# Patient Record
Sex: Female | Born: 1965 | Race: White | Hispanic: No | Marital: Married | Smoking: Never smoker
Health system: Southern US, Community
[De-identification: ages and names within clinical notes are randomized; demographics above are authoritative.]

## PROBLEM LIST (undated history)

## (undated) DIAGNOSIS — E079 Disorder of thyroid, unspecified: Secondary | ICD-10-CM

## (undated) DIAGNOSIS — E039 Hypothyroidism, unspecified: Secondary | ICD-10-CM

## (undated) DIAGNOSIS — E119 Type 2 diabetes mellitus without complications: Secondary | ICD-10-CM

## (undated) DIAGNOSIS — I499 Cardiac arrhythmia, unspecified: Secondary | ICD-10-CM

## (undated) DIAGNOSIS — E785 Hyperlipidemia, unspecified: Secondary | ICD-10-CM

## (undated) HISTORY — PX: ABDOMINAL HYSTERECTOMY: SHX81

## (undated) HISTORY — PX: BLADDER SURGERY: SHX569

---

## 2010-01-23 ENCOUNTER — Ambulatory Visit: Payer: Self-pay | Admitting: Internal Medicine

## 2012-03-12 ENCOUNTER — Ambulatory Visit: Payer: Self-pay | Admitting: Family Medicine

## 2017-04-27 ENCOUNTER — Ambulatory Visit
Admission: EM | Admit: 2017-04-27 | Discharge: 2017-04-27 | Disposition: A | Discharge status: Home / Self Care | Payer: BLUE CROSS/BLUE SHIELD | Attending: Family Medicine | Admitting: Family Medicine

## 2017-04-27 ENCOUNTER — Encounter: Payer: Self-pay | Admitting: Emergency Medicine

## 2017-04-27 DIAGNOSIS — J020 Streptococcal pharyngitis: Secondary | ICD-10-CM | POA: Diagnosis not present

## 2017-04-27 HISTORY — DX: Type 2 diabetes mellitus without complications: E11.9

## 2017-04-27 HISTORY — DX: Disorder of thyroid, unspecified: E07.9

## 2017-04-27 LAB — RAPID STREP SCREEN (MED CTR MEBANE ONLY): Streptococcus, Group A Screen (Direct): POSITIVE — AB

## 2017-04-27 MED ORDER — PENICILLIN G BENZATHINE 1200000 UNIT/2ML IM SUSP
1.2000 10*6.[IU] | Freq: Once | INTRAMUSCULAR | Status: AC
  Administered 2017-04-27: 1.2 10*6.[IU] via INTRAMUSCULAR

## 2017-04-27 NOTE — ED Triage Notes (Signed)
Patient c/o sore throat since Friday night.  Patient denies fevers

## 2017-04-27 NOTE — ED Provider Notes (Signed)
[facility]    CSN: [id number] Arrival date & time: 04/27/17  0847     History   Chief Complaint Chief Complaint  Patient presents with  . Sore Throat    HPI Donna Strickland is a 51 y.o. female.   Patient is a 51 year old white female who started having a sore throat on Friday. She has some chills did not check her temperature but also had cough better so she came in today to be seen and evaluated. History of diabetes and thyroid disease. Past medical history is positive for abdominal hysterectomy she does not smoke. No known drug allergies.   The history is provided by the patient. No language interpreter was used.  Sore Throat  This is a new problem. The current episode started more than 2 days ago. The problem occurs constantly. The problem has been gradually worsening. Pertinent negatives include no chest pain, no abdominal pain, no headaches and no shortness of breath. The symptoms are aggravated by eating. Nothing relieves the symptoms. Treatments tried: Motrin.    Past Medical History:  Diagnosis Date  . Diabetes mellitus without complication ([place])   . Thyroid disease     There are no active problems to display for this patient.   Past Surgical History:  Procedure Laterality Date  . ABDOMINAL HYSTERECTOMY      OB History    No data available       Home Medications    Prior to Admission medications   Medication Sig Start Date End Date Taking? Authorizing Provider  levothyroxine (SYNTHROID, LEVOTHROID) 100 MCG tablet Take 100 mcg by mouth daily before breakfast.   Yes [provider]  metFORMIN (GLUCOPHAGE) 1000 MG tablet Take 1,000 mg by mouth 2 (two) times daily with a meal.   Yes [provider]    Family History History reviewed. No pertinent family history.  Social History Social History  Substance Use Topics  . Smoking status: Never Smoker  . Smokeless tobacco: Never Used  . Alcohol use No     Allergies     Patient has no known allergies.   Review of Systems Review of Systems  HENT: Positive for sore throat.   Respiratory: Negative for shortness of breath.   Cardiovascular: Negative for chest pain.  Gastrointestinal: Negative for abdominal pain.  Neurological: Negative for headaches.  All other systems reviewed and are negative.    Physical Exam Triage Vital Signs ED Triage Vitals  Enc Vitals Group     BP 04/27/17 0905 110/79     Pulse Rate 04/27/17 0905 (!) 107     Resp 04/27/17 0905 16     Temp 04/27/17 0905 99.1 F (37.3 C)     Temp Source 04/27/17 0905 Oral     SpO2 04/27/17 0905 100 %     Weight 04/27/17 0901 193 lb (87.5 kg)     Height 04/27/17 0901 5\' 7"  (1.702 m)     Head Circumference --      Peak Flow --      Pain Score 04/27/17 0902 9     Pain Loc --      Pain Edu? --      Excl. in [place]? --    No data found.   Updated Vital Signs BP 110/79 (BP Location: Left Arm)   Pulse (!) 107   Temp 99.1 F (37.3 C) (Oral)   Resp 16   Ht 5\' 7"  (1.702 m)   Wt 193 lb (87.5 kg)  SpO2 100%   BMI 30.23 kg/m   Visual Acuity Right Eye Distance:   Left Eye Distance:   Bilateral Distance:    Right Eye Near:   Left Eye Near:    Bilateral Near:     Physical Exam  Constitutional: She appears well-developed and well-nourished.  HENT:  Head: Normocephalic and atraumatic.  Right Ear: Hearing, tympanic membrane, external ear and ear canal normal.  Left Ear: Hearing, tympanic membrane and external ear normal.  Nose: Nose normal. No mucosal edema.  Mouth/Throat: Uvula is midline. Posterior oropharyngeal erythema present. No tonsillar abscesses. Tonsils are 2+ on the right. Tonsils are 2+ on the left. Tonsillar exudate.  Eyes: Pupils are equal, round, and reactive to light.  Neck: Normal range of motion.  Pulmonary/Chest: Effort normal.  Musculoskeletal: Normal range of motion.  Lymphadenopathy:    She has cervical adenopathy.  Neurological: She is alert.  Skin: Skin  is warm.  Psychiatric: She has a normal mood and affect.  Vitals reviewed.    UC Treatments / Results  Labs (all labs ordered are listed, but only abnormal results are displayed) Labs Reviewed  RAPID STREP SCREEN (NOT AT [facility]) - Abnormal; Notable for the following:       Result Value   Streptococcus, Group A Screen (Direct) POSITIVE (*)    All other components within normal limits    EKG  EKG Interpretation None       Radiology No results found.  Procedures Procedures (including critical care time)  Medications Ordered in UC Medications  penicillin g benzathine (BICILLIN LA) 1200000 UNIT/2ML injection 1.2 Million Units (not administered)   Results for orders placed or performed during the hospital encounter of 04/27/17  Rapid strep screen  Result Value Ref Range   Streptococcus, Group A Screen (Direct) POSITIVE (A) NEGATIVE    Initial Impression / Assessment and Plan / UC Course  I have reviewed the triage vital signs and the nursing notes.  Pertinent labs & imaging results that were available during my care of the patient were reviewed by me and considered in my medical decision making (see chart for details).    LA Bicillin 1.2 milliunits IM given. Work note for tomorrow as well follow-up with PCP in 2 weeks needed. Final Clinical Impressions(s) / UC Diagnoses   Final diagnoses:  Strep pharyngitis    New Prescriptions New Prescriptions   No medications on file    Note: This dictation was prepared with Dragon dictation along with smaller phrase technology. Any transcriptional errors that result from this process are unintentional.   Frederich Cha, MD 04/27/17 [id number]

## 2017-12-19 ENCOUNTER — Encounter: Payer: Self-pay | Admitting: Emergency Medicine

## 2017-12-19 ENCOUNTER — Ambulatory Visit
Admission: EM | Admit: 2017-12-19 | Discharge: 2017-12-19 | Disposition: A | Discharge status: Home / Self Care | Payer: BLUE CROSS/BLUE SHIELD | Attending: Family Medicine | Admitting: Family Medicine

## 2017-12-19 ENCOUNTER — Other Ambulatory Visit: Payer: Self-pay

## 2017-12-19 DIAGNOSIS — S29019A Strain of muscle and tendon of unspecified wall of thorax, initial encounter: Secondary | ICD-10-CM

## 2017-12-19 DIAGNOSIS — S29012A Strain of muscle and tendon of back wall of thorax, initial encounter: Secondary | ICD-10-CM | POA: Diagnosis not present

## 2017-12-19 HISTORY — DX: Hyperlipidemia, unspecified: E78.5

## 2017-12-19 MED ORDER — CYCLOBENZAPRINE HCL 10 MG PO TABS
10.0000 mg | ORAL_TABLET | Freq: Three times a day (TID) | ORAL | 0 refills | Status: DC | PRN
Start: 2017-12-19 — End: 2020-08-04

## 2017-12-19 NOTE — ED Provider Notes (Signed)
[facility]    CSN: [id number] Arrival date & time: 12/19/17  1631     History   Chief Complaint Chief Complaint  Patient presents with  . Back Pain    APPT    HPI Donna Strickland is a 51 y.o. female.   51 yo female with a c/o left upper back pain for 2-3 days. Denies any falls, injuries/trauma, rash, fevers, chills.   The history is provided by the patient.  Back Pain    Past Medical History:  Diagnosis Date  . Diabetes mellitus without complication ([place])   . Hyperlipidemia   . Thyroid disease     There are no active problems to display for this patient.   Past Surgical History:  Procedure Laterality Date  . ABDOMINAL HYSTERECTOMY    . BLADDER SURGERY      OB History    No data available       Home Medications    Prior to Admission medications   Medication Sig Start Date End Date Taking? Authorizing Provider  levothyroxine (SYNTHROID, LEVOTHROID) 100 MCG tablet Take 100 mcg by mouth daily before breakfast.   Yes [provider]  metFORMIN (GLUCOPHAGE) 1000 MG tablet Take 1,000 mg by mouth 2 (two) times daily with a meal.   Yes [provider]  simvastatin (ZOCOR) 10 MG tablet Take 10 mg by mouth daily.   Yes [provider]  cyclobenzaprine (FLEXERIL) 10 MG tablet Take 1 tablet (10 mg total) by mouth 3 (three) times daily as needed for muscle spasms. 12/19/17   Norval Gable, MD    Family History Family History  Problem Relation Age of Onset  . Diabetes Mother   . Diabetes Father   . Hypertension Father     Social History Social History   Tobacco Use  . Smoking status: Never Smoker  . Smokeless tobacco: Never Used  Substance Use Topics  . Alcohol use: No  . Drug use: No     Allergies   Patient has no known allergies.   Review of Systems Review of Systems  Musculoskeletal: Positive for back pain.     Physical Exam Triage Vital Signs ED Triage Vitals  Enc Vitals Group     BP  12/19/17 1648 117/69     Pulse Rate 12/19/17 1648 76     Resp 12/19/17 1648 16     Temp 12/19/17 1648 98.2 F (36.8 C)     Temp Source 12/19/17 1648 Oral     SpO2 12/19/17 1648 100 %     Weight 12/19/17 1647 185 lb (83.9 kg)     Height 12/19/17 1647 5\' 7"  (1.702 m)     Head Circumference --      Peak Flow --      Pain Score 12/19/17 1648 8     Pain Loc --      Pain Edu? --      Excl. in [place]? --    No data found.  Updated Vital Signs BP 117/69 (BP Location: Left Arm)   Pulse 76   Temp 98.2 F (36.8 C) (Oral)   Resp 16   Ht 5\' 7"  (1.702 m)   Wt 185 lb (83.9 kg)   SpO2 100%   BMI 28.98 kg/m   Visual Acuity Right Eye Distance:   Left Eye Distance:   Bilateral Distance:    Right Eye Near:   Left Eye Near:    Bilateral Near:     Physical Exam  Constitutional:  She appears well-developed and well-nourished. No distress.  Musculoskeletal:       Cervical back: She exhibits tenderness (over the left trapezius and thoracic paraspinous muscles on the left) and spasm.  Skin: She is not diaphoretic.  Nursing note and vitals reviewed.    UC Treatments / Results  Labs (all labs ordered are listed, but only abnormal results are displayed) Labs Reviewed - No data to display  EKG  EKG Interpretation None       Radiology No results found.  Procedures Procedures (including critical care time)  Medications Ordered in UC Medications - No data to display   Initial Impression / Assessment and Plan / UC Course  I have reviewed the triage vital signs and the nursing notes.  Pertinent labs & imaging results that were available during my care of the patient were reviewed by me and considered in my medical decision making (see chart for details).       Final Clinical Impressions(s) / UC Diagnoses   Final diagnoses:  Thoracic myofascial strain, initial encounter    ED Discharge Orders        Ordered    cyclobenzaprine (FLEXERIL) 10 MG tablet  3 times daily PRN       12/19/17 1808     1. diagnosis reviewed with patient 2. rx as per orders above; reviewed possible side effects, interactions, risks and benefits  3. Recommend supportive treatment with heat/ice, stretching, otc analgesics/nsaids prn 4. Follow-up prn if symptoms worsen or don't improve  Controlled Substance Prescriptions [place] Controlled Substance Registry consulted? Not Applicable   Norval Gable, MD 12/19/17 [place]

## 2017-12-19 NOTE — ED Triage Notes (Signed)
Patient in today c/o 2-3 days of upper left sided back pain and shoulder pain. No injury noted. No cough. Patient has been using Ibuprofen for the pain without a lot of relief.

## 2018-01-20 ENCOUNTER — Other Ambulatory Visit: Payer: Self-pay | Admitting: Family Medicine

## 2018-01-20 DIAGNOSIS — Z1231 Encounter for screening mammogram for malignant neoplasm of breast: Secondary | ICD-10-CM

## 2018-01-28 ENCOUNTER — Ambulatory Visit
Admission: RE | Admit: 2018-01-28 | Discharge: 2018-01-28 | Disposition: A | Discharge status: Home / Self Care | Payer: BLUE CROSS/BLUE SHIELD | Source: Ambulatory Visit | Attending: Family Medicine | Admitting: Family Medicine

## 2018-01-28 DIAGNOSIS — Z1231 Encounter for screening mammogram for malignant neoplasm of breast: Secondary | ICD-10-CM | POA: Diagnosis present

## 2020-04-10 HISTORY — PX: SVT ABLATION: EP1225

## 2020-08-04 ENCOUNTER — Ambulatory Visit
Admission: EM | Admit: 2020-08-04 | Discharge: 2020-08-04 | Disposition: A | Discharge status: Home / Self Care | Payer: BLUE CROSS/BLUE SHIELD | Attending: Internal Medicine | Admitting: Internal Medicine

## 2020-08-04 ENCOUNTER — Ambulatory Visit (INDEPENDENT_AMBULATORY_CARE_PROVIDER_SITE_OTHER): Payer: BLUE CROSS/BLUE SHIELD

## 2020-08-04 DIAGNOSIS — J209 Acute bronchitis, unspecified: Secondary | ICD-10-CM | POA: Diagnosis not present

## 2020-08-04 MED ORDER — ALBUTEROL SULFATE HFA 108 (90 BASE) MCG/ACT IN AERS
2.0000 | INHALATION_SPRAY | Freq: Four times a day (QID) | RESPIRATORY_TRACT | 0 refills | Status: AC | PRN
Start: 2020-08-04 — End: ?

## 2020-08-04 MED ORDER — METHYLPREDNISOLONE SODIUM SUCC 40 MG IJ SOLR
40.0000 mg | Freq: Once | INTRAMUSCULAR | Status: AC
  Administered 2020-08-04: 40 mg via INTRAMUSCULAR

## 2020-08-04 MED ORDER — PREDNISONE 10 MG PO TABS
10.0000 mg | ORAL_TABLET | Freq: Every day | ORAL | 0 refills | Status: AC
Start: 2020-08-04 — End: 2020-08-07

## 2020-08-04 MED ORDER — AEROCHAMBER PLUS FLO-VU LARGE MISC: 1.0000 | Freq: Once | 0 refills | Status: AC

## 2020-08-04 MED ORDER — GUAIFENESIN ER 600 MG PO TB12: 600.0000 mg | ORAL_TABLET | Freq: Two times a day (BID) | ORAL | 0 refills | Status: AC

## 2020-08-04 NOTE — ED Triage Notes (Signed)
Pt had Moderna #2 yesterday.  Sudden onset SOB x 2 hours ago while at work.  Denies CP, HA, n/v, dizziness, etc.  Wheezing noted during intake which pt states is not normal for her.

## 2020-08-04 NOTE — Discharge Instructions (Addendum)
Use inhaler with spacer as recommended If your symptoms worsen please return to the urgent care to be reevaluated

## 2020-08-04 NOTE — ED Provider Notes (Signed)
[facility]    CSN: [id number] Arrival date & time: 08/04/20  1127      History   Chief Complaint No chief complaint on file.   HPI Donna Strickland is a 54 y.o. female comes to the urgent care with 2-hour history of sudden onset shortness of breath, feeling of unable to take a deep full breath, wheezing and a chesty cough.  Patient has been having nasal stuffiness and some wet cough.  Patient denies any fever or chills.  No sick contacts.  She had a second dose of Moderna vaccine yesterday.  His symptoms predate the Moderna COVID-19 vaccination.  No palpitations.  No nausea, vomiting, diarrhea, loss of taste or smell.  HPI  Past Medical History:  Diagnosis Date  . Diabetes mellitus without complication ([place])   . Hyperlipidemia   . Thyroid disease     There are no problems to display for this patient.   Past Surgical History:  Procedure Laterality Date  . ABDOMINAL HYSTERECTOMY    . BLADDER SURGERY      OB History   No obstetric history on file.      Home Medications    Prior to Admission medications   Medication Sig Start Date End Date Taking? Authorizing Provider  Empagliflozin (JARDIANCE PO) Take by mouth.   Yes [provider]  glipiZIDE (GLUCOTROL) 10 MG tablet Take 10 mg by mouth daily before breakfast.   Yes [provider]  levothyroxine (SYNTHROID, LEVOTHROID) 100 MCG tablet Take 100 mcg by mouth daily before breakfast.   Yes [provider]  metFORMIN (GLUCOPHAGE) 1000 MG tablet Take 1,000 mg by mouth 2 (two) times daily with a meal.   Yes [provider]  simvastatin (ZOCOR) 10 MG tablet Take 10 mg by mouth daily.   Yes [provider]  albuterol (VENTOLIN HFA) 108 (90 Base) MCG/ACT inhaler Inhale 2 puffs into the lungs every 6 (six) hours as needed for wheezing or shortness of breath. 08/04/20   Delena Casebeer, Myrene Galas, MD  guaiFENesin (MUCINEX) 600 MG 12 hr tablet Take 1 tablet (600 mg total) by mouth  2 (two) times daily for 10 days. 08/04/20 08/14/20  Chase Picket, MD  predniSONE (DELTASONE) 10 MG tablet Take 1 tablet (10 mg total) by mouth daily for 3 days. 08/04/20 08/07/20  LampteyMyrene Galas, MD  Spacer/Aero-Holding Chambers (AEROCHAMBER PLUS FLO-VU LARGE) MISC 1 each by Other route once for 1 dose. 08/04/20 08/04/20  Chase Picket, MD    Family History Family History  Problem Relation Age of Onset  . Diabetes Mother   . Diabetes Father   . Hypertension Father   . Breast cancer Neg Hx     Social History Social History   Tobacco Use  . Smoking status: Never Smoker  . Smokeless tobacco: Never Used  Vaping Use  . Vaping Use: Never used  Substance Use Topics  . Alcohol use: No  . Drug use: No     Allergies   Patient has no known allergies.   Review of Systems Review of Systems  HENT: Positive for congestion. Negative for postnasal drip, sinus pressure, sinus pain, sore throat and voice change.   Respiratory: Positive for cough, chest tightness, shortness of breath and wheezing.   Cardiovascular: Negative for chest pain, palpitations and leg swelling.  Gastrointestinal: Negative.   Genitourinary: Negative.   Musculoskeletal: Negative.   Skin: Negative.   Neurological: Negative.  Negative for dizziness, numbness and headaches.  Physical Exam Triage Vital Signs ED Triage Vitals  Enc Vitals Group     BP      Pulse      Resp      Temp      Temp src      SpO2      Weight      Height      Head Circumference      Peak Flow      Pain Score      Pain Loc      Pain Edu?      Excl. in [place]?    No data found.  Updated Vital Signs BP (!) 144/76 (BP Location: Right Arm)   Pulse 95   Resp (!) 24   SpO2 93%   Visual Acuity Right Eye Distance:   Left Eye Distance:   Bilateral Distance:    Right Eye Near:   Left Eye Near:    Bilateral Near:     Physical Exam Vitals and nursing note reviewed.  Constitutional:      General: She is not in acute  distress.    Appearance: Normal appearance. She is not ill-appearing.  HENT:     Right Ear: Tympanic membrane normal.     Left Ear: Tympanic membrane normal.  Cardiovascular:     Rate and Rhythm: Normal rate and regular rhythm.     Pulses: Normal pulses.     Heart sounds: Normal heart sounds.  Pulmonary:     Effort: Respiratory distress present.     Breath sounds: No stridor. Wheezing present. No rhonchi or rales.  Abdominal:     General: Bowel sounds are normal.     Palpations: Abdomen is soft.  Musculoskeletal:        General: Normal range of motion.  Skin:    General: Skin is warm.     Capillary Refill: Capillary refill takes less than 2 seconds.  Neurological:     General: No focal deficit present.     Mental Status: She is alert and oriented to person, place, and time.      UC Treatments / Results  Labs (all labs ordered are listed, but only abnormal results are displayed) Labs Reviewed - No data to display  EKG   Radiology DG Chest 2 View  Result Date: 08/04/2020 CLINICAL DATA:  Shortness of breath for 2 hours EXAM: CHEST - 2 VIEW COMPARISON:  None. FINDINGS: The heart size and mediastinal contours are within normal limits. Both lungs are clear. The visualized skeletal structures are unremarkable. IMPRESSION: No active cardiopulmonary disease. Electronically Signed   By: Inez Catalina M.D.   On: 08/04/2020 12:20    Procedures Procedures (including critical care time)  Medications Ordered in UC Medications  methylPREDNISolone sodium succinate (SOLU-MEDROL) 40 mg/mL injection 40 mg (40 mg Intramuscular Given 08/04/20 1244)    Initial Impression / Assessment and Plan / UC Course  I have reviewed the triage vital signs and the nursing notes.  Pertinent labs & imaging results that were available during my care of the patient were reviewed by me and considered in my medical decision making (see chart for details).     1.  Acute bronchitis with  bronchospasm: Chest x-ray is negative for acute lung infiltrate Solu-Medrol 40 mg x 1 dose Prednisone 10 mg orally daily for 5 days Albuterol inhaler with a spacer Humibid as needed for sputum production Return precautions given If patient symptoms worsens she is advised to return to the urgent  care to be reevaluated. Final Clinical Impressions(s) / UC Diagnoses   Final diagnoses:  Acute bronchitis with bronchospasm     Discharge Instructions     Use inhaler with spacer as recommended If your symptoms worsen please return to the urgent care to be reevaluated    ED Prescriptions    Medication Sig Dispense Auth. Provider   albuterol (VENTOLIN HFA) 108 (90 Base) MCG/ACT inhaler Inhale 2 puffs into the lungs every 6 (six) hours as needed for wheezing or shortness of breath. 18 g Edith Lord, Myrene Galas, MD   Spacer/Aero-Holding Chambers (AEROCHAMBER PLUS FLO-VU LARGE) MISC 1 each by Other route once for 1 dose. 1 each Caiden Monsivais, Myrene Galas, MD   predniSONE (DELTASONE) 10 MG tablet Take 1 tablet (10 mg total) by mouth daily for 3 days. 3 tablet Kaleb Linquist, Myrene Galas, MD   guaiFENesin (MUCINEX) 600 MG 12 hr tablet Take 1 tablet (600 mg total) by mouth 2 (two) times daily for 10 days. 20 tablet Meela Wareing, Myrene Galas, MD     PDMP not reviewed this encounter.   Chase Picket, MD 08/04/20 1322

## 2020-10-05 ENCOUNTER — Other Ambulatory Visit
Admission: RE | Admit: 2020-10-05 | Discharge: 2020-10-05 | Disposition: A | Discharge status: Home / Self Care | Payer: BLUE CROSS/BLUE SHIELD | Source: Ambulatory Visit | Attending: Internal Medicine | Admitting: Internal Medicine

## 2020-10-05 DIAGNOSIS — Z20822 Contact with and (suspected) exposure to covid-19: Secondary | ICD-10-CM | POA: Diagnosis not present

## 2020-10-05 DIAGNOSIS — Z01818 Encounter for other preprocedural examination: Secondary | ICD-10-CM | POA: Diagnosis present

## 2020-10-05 LAB — SARS CORONAVIRUS 2 (TAT 6-24 HRS): SARS Coronavirus 2: NEGATIVE

## 2020-10-06 ENCOUNTER — Encounter: Payer: Self-pay | Admitting: Internal Medicine

## 2020-10-09 ENCOUNTER — Ambulatory Visit: Payer: BLUE CROSS/BLUE SHIELD | Admitting: Certified Registered Nurse Anesthetist

## 2020-10-09 ENCOUNTER — Ambulatory Visit
Admission: RE | Admit: 2020-10-09 | Discharge: 2020-10-09 | Disposition: A | Discharge status: Home / Self Care | Payer: BLUE CROSS/BLUE SHIELD | Attending: Internal Medicine | Admitting: Internal Medicine

## 2020-10-09 ENCOUNTER — Encounter
Admission: RE | Disposition: A | Discharge status: Home / Self Care | Payer: Self-pay | Source: Home / Self Care | Attending: Internal Medicine

## 2020-10-09 DIAGNOSIS — K640 First degree hemorrhoids: Secondary | ICD-10-CM | POA: Insufficient documentation

## 2020-10-09 DIAGNOSIS — Z86010 Personal history of colonic polyps: Secondary | ICD-10-CM | POA: Diagnosis not present

## 2020-10-09 DIAGNOSIS — Z1211 Encounter for screening for malignant neoplasm of colon: Secondary | ICD-10-CM | POA: Diagnosis present

## 2020-10-09 DIAGNOSIS — K529 Noninfective gastroenteritis and colitis, unspecified: Secondary | ICD-10-CM | POA: Insufficient documentation

## 2020-10-09 DIAGNOSIS — D120 Benign neoplasm of cecum: Secondary | ICD-10-CM | POA: Insufficient documentation

## 2020-10-09 HISTORY — DX: Cardiac arrhythmia, unspecified: I49.9

## 2020-10-09 HISTORY — PX: COLONOSCOPY WITH PROPOFOL: SHX5780

## 2020-10-09 HISTORY — DX: Hypothyroidism, unspecified: E03.9

## 2020-10-09 LAB — GLUCOSE, CAPILLARY: Glucose-Capillary: 169 mg/dL — ABNORMAL HIGH (ref 70–99)

## 2020-10-09 SURGERY — COLONOSCOPY WITH PROPOFOL
Anesthesia: General

## 2020-10-09 MED ORDER — LIDOCAINE HCL (CARDIAC) PF 100 MG/5ML IV SOSY
PREFILLED_SYRINGE | INTRAVENOUS | Status: DC | PRN
  Administered 2020-10-09: 80 mg via INTRAVENOUS

## 2020-10-09 MED ORDER — PROPOFOL 10 MG/ML IV BOLUS
INTRAVENOUS | Status: DC | PRN
  Administered 2020-10-09: 70 mg via INTRAVENOUS
  Administered 2020-10-09: 30 mg via INTRAVENOUS

## 2020-10-09 MED ORDER — SODIUM CHLORIDE 0.9 % IV SOLN: INTRAVENOUS | Status: DC

## 2020-10-09 MED ORDER — PROPOFOL 500 MG/50ML IV EMUL
INTRAVENOUS | Status: DC | PRN
  Administered 2020-10-09: 100 ug/kg/min via INTRAVENOUS

## 2020-10-09 NOTE — H&P (Signed)
  Outpatient short stay form Pre-procedure 10/09/2020 10:57 AM Donna Strickland Donna Strickland, M.D.  Primary Physician: Donna Strickland, M.D.  Reason for visit:  History of colon polyps  History of present illness:                            Patient presents for colonoscopy for a personal hx of colon polyps. The patient denies abdominal pain, abnormal weight loss or rectal bleeding.     No current facility-administered medications for this encounter.  Medications Prior to Admission  Medication Sig Dispense Refill Last Dose  . albuterol (VENTOLIN HFA) 108 (90 Base) MCG/ACT inhaler Inhale 2 puffs into the lungs every 6 (six) hours as needed for wheezing or shortness of breath. 18 g 0 Past Week at Unknown time  . Empagliflozin (JARDIANCE PO) Take by mouth.   Past Week at Unknown time  . fluticasone (FLONASE) 50 MCG/ACT nasal spray Place 2 sprays into both nostrils daily.   Past Week at Unknown time  . glipiZIDE (GLUCOTROL) 10 MG tablet Take 10 mg by mouth daily before breakfast.   Past Week at Unknown time  . levothyroxine (SYNTHROID, LEVOTHROID) 100 MCG tablet Take 100 mcg by mouth daily before breakfast.   Past Week at Unknown time  . metFORMIN (GLUCOPHAGE) 1000 MG tablet Take 1,000 mg by mouth 2 (two) times daily with a meal.   Past Week at Unknown time  . simvastatin (ZOCOR) 10 MG tablet Take 10 mg by mouth daily.   Past Week at Unknown time  . sitaGLIPtin (JANUVIA) 100 MG tablet Take 100 mg by mouth daily.   Past Week at Unknown time     Allergies  Allergen Reactions  . Sesame Seed (Diagnostic) Itching     Past Medical History:  Diagnosis Date  . Diabetes mellitus without complication ([place])   . Dysrhythmia    SVT  . Hyperlipidemia   . Hypothyroidism   . Thyroid disease     Review of systems:  Otherwise negative.    Physical Exam  Gen: Alert, oriented. Appears stated age.  HEENT: [place]/AT. PERRLA. Lungs: CTA, no wheezes. CV: RR nl S1, S2. Abd: soft, benign, no masses. BS+ Ext: No  edema. Pulses 2+    Planned procedures: Proceed with colonoscopy. The patient understands the nature of the planned procedure, indications, risks, alternatives and potential complications including but not limited to bleeding, infection, perforation, damage to internal organs and possible oversedation/side effects from anesthesia. The patient agrees and gives consent to proceed.  Please refer to procedure notes for findings, recommendations and patient disposition/instructions.     Donna Strickland Donna Strickland, M.D. Gastroenterology 10/09/2020  10:57 AM

## 2020-10-09 NOTE — Transfer of Care (Signed)
Immediate Anesthesia Transfer of Care Note  Patient: Donna Strickland  Procedure(s) Performed: COLONOSCOPY WITH PROPOFOL (N/A )  Patient Location: PACU and Endoscopy Unit  Anesthesia Type:General  Level of Consciousness: awake, drowsy and patient cooperative  Airway & Oxygen Therapy: Patient Spontanous Breathing  Post-op Assessment: Report given to RN and Post -op Vital signs reviewed and stable  Post vital signs: Reviewed and stable  Last Vitals:  Vitals Value Taken Time  BP 114/70 10/09/20 1147  Temp    Pulse 78 10/09/20 1148  Resp 14 10/09/20 1148  SpO2 94 % 10/09/20 1148  Vitals shown include unvalidated device data.  Last Pain:  Vitals:   10/09/20 1147  TempSrc:   PainSc: 0-No pain         Complications: No complications documented.

## 2020-10-09 NOTE — Op Note (Addendum)
[facility] Gastroenterology Patient Name: Donna Strickland Procedure Date: 10/09/2020 10:57 AM MRN: [id number] Account #: [id number] Date of Birth: 06/30/1966 Admit Type: Outpatient Age: 54 Room: [facility] ENDO ROOM 3 Gender: Female Note Status: Finalized Procedure:             Colonoscopy Indications:           High risk colon cancer surveillance: Personal history                         of colonic polyps Providers:             Benay Pike. Alice Reichert MD, MD Referring MD:          Kerin Perna MD, MD (Referring MD) Medicines:             Propofol per Anesthesia Complications:         No immediate complications. Procedure:             Pre-Anesthesia Assessment:                        - The risks and benefits of the procedure and the                         sedation options and risks were discussed with the                         patient. All questions were answered and informed                         consent was obtained.                        - Patient identification and proposed procedure were                         verified prior to the procedure by the nurse. The                         procedure was verified in the procedure room.                        - ASA Grade Assessment: III - A patient with severe                         systemic disease.                        - After reviewing the risks and benefits, the patient                         was deemed in satisfactory condition to undergo the                         procedure.                        After obtaining informed consent, the colonoscope was                         passed under direct vision. Throughout  the procedure,                         the patient's blood pressure, pulse, and oxygen                         saturations were monitored continuously. The                         Colonoscope was introduced through the anus and                         advanced to the the cecum, identified by  appendiceal                         orifice and ileocecal valve. The colonoscopy was                         performed without difficulty. The patient tolerated                         the procedure well. The quality of the bowel                         preparation was good. The ileocecal valve, appendiceal                         orifice, and rectum were photographed. The ileocecal                         valve, appendiceal orifice, and rectum were                         photographed. Findings:      The perianal and digital rectal examinations were normal. Pertinent       negatives include normal sphincter tone and no palpable rectal lesions.      Two sessile polyps were found in the cecum. The polyps were 3 to 4 mm in       size. These polyps were removed with a jumbo cold forceps. Resection and       retrieval were complete.      A segmental area of mildly erythematous mucosa was found in the       ascending colon. This was biopsied with a cold forceps for histology.      Non-bleeding internal hemorrhoids were found during retroflexion. The       hemorrhoids were Grade I (internal hemorrhoids that do not prolapse).      The exam was otherwise without abnormality. Impression:            - Two 3 to 4 mm polyps in the cecum, removed with a                         jumbo cold forceps. Resected and retrieved.                        - Erythematous mucosa in the ascending colon. Biopsied.                        - Non-bleeding internal  hemorrhoids.                        - The examination was otherwise normal. Recommendation:        - Patient has a contact number available for                         emergencies. The signs and symptoms of potential                         delayed complications were discussed with the patient.                         Return to normal activities tomorrow. Written                         discharge instructions were provided to the patient.                         - Resume previous diet.                        - Continue present medications.                        - Await pathology results.                        - Repeat colonoscopy is recommended for surveillance.                         The colonoscopy date will be determined after                         pathology results from today's exam become available                         for review.                        - Return to GI office PRN. Procedure Code(s):     --- Professional ---                        [phone], Colonoscopy, flexible; with biopsy, single or                         multiple Diagnosis Code(s):     --- Professional ---                        K64.0, First degree hemorrhoids                        K63.89, Other specified diseases of intestine                        K63.5, Polyp of colon                        Z86.010, Personal history of colonic polyps CPT copyright 2019 American Medical Association. All rights reserved. The codes documented in this report are preliminary  and upon coder review may  be revised to meet current compliance requirements. Efrain Sella MD, MD 10/09/2020 11:48:02 AM This report has been signed electronically. Number of Addenda: 0 Note Initiated On: 10/09/2020 10:57 AM Scope Withdrawal Time: 0 hours 10 minutes 2 seconds  Total Procedure Duration: 0 hours 17 minutes 7 seconds  Estimated Blood Loss:  Estimated blood loss: none.      [facility]

## 2020-10-09 NOTE — Interval H&P Note (Signed)
History and Physical Interval Note:  10/09/2020 11:00 AM  Donna Strickland  has presented today for surgery, with the diagnosis of Personal history of colon polyps.  The various methods of treatment have been discussed with the patient and family. After consideration of risks, benefits and other options for treatment, the patient has consented to  Procedure(s): COLONOSCOPY WITH PROPOFOL (N/A) as a surgical intervention.  The patient's history has been reviewed, patient examined, no change in status, stable for surgery.  I have reviewed the patient's chart and labs.  Questions were answered to the patient's satisfaction.     [place], [place]

## 2020-10-09 NOTE — Anesthesia Preprocedure Evaluation (Signed)
Anesthesia Evaluation  Patient identified by MRN, date of birth, ID band Patient awake    Reviewed: Allergy & Precautions, H&P , NPO status , Patient's Chart, lab work & pertinent test results, reviewed documented beta blocker date and time   Airway Mallampati: II   Neck ROM: full    Dental  (+) Poor Dentition   Pulmonary neg pulmonary ROS,    Pulmonary exam normal        Cardiovascular Exercise Tolerance: Good Normal cardiovascular exam+ dysrhythmias  Rhythm:regular Rate:Normal     Neuro/Psych negative neurological ROS  negative psych ROS   GI/Hepatic negative GI ROS, Neg liver ROS,   Endo/Other  diabetes, Well Controlled, Type 2, Oral Hypoglycemic AgentsHypothyroidism   Renal/GU negative Renal ROS  negative genitourinary   Musculoskeletal   Abdominal   Peds  Hematology negative hematology ROS (+)   Anesthesia Other Findings Past Medical History: No date: Diabetes mellitus without complication (HCC) No date: Dysrhythmia     Comment:  SVT No date: Hyperlipidemia No date: Hypothyroidism No date: Thyroid disease Past Surgical History: No date: ABDOMINAL HYSTERECTOMY No date: BLADDER SURGERY 04/10/2020: SVT ABLATION BMI    Body Mass Index: 31.17 kg/m     Reproductive/Obstetrics negative OB ROS                             Anesthesia Physical Anesthesia Plan  ASA: II  Anesthesia Plan: General   Post-op Pain Management:    Induction:   PONV Risk Score and Plan:   Airway Management Planned:   Additional Equipment:   Intra-op Plan:   Post-operative Plan:   Informed Consent: I have reviewed the patients History and Physical, chart, labs and discussed the procedure including the risks, benefits and alternatives for the proposed anesthesia with the patient or authorized representative who has indicated his/her understanding and acceptance.     Dental Advisory  Given  Plan Discussed with: CRNA  Anesthesia Plan Comments:         Anesthesia Quick Evaluation

## 2020-10-10 ENCOUNTER — Encounter: Payer: Self-pay | Admitting: Internal Medicine

## 2020-10-10 LAB — SURGICAL PATHOLOGY

## 2020-10-10 NOTE — Anesthesia Postprocedure Evaluation (Signed)
Anesthesia Post Note  Patient: Donna Strickland  Procedure(s) Performed: COLONOSCOPY WITH PROPOFOL (N/A )  Patient location during evaluation: PACU Anesthesia Type: General Level of consciousness: awake and alert Pain management: pain level controlled Vital Signs Assessment: post-procedure vital signs reviewed and stable Respiratory status: spontaneous breathing, nonlabored ventilation and respiratory function stable Cardiovascular status: blood pressure returned to baseline and stable Postop Assessment: no apparent nausea or vomiting Anesthetic complications: no   No complications documented.   Last Vitals:  Vitals:   10/09/20 1157 10/09/20 1207  BP: 109/76 123/76  Pulse: 74 68  Resp: (!) 21 18  Temp:    SpO2: 97% 97%    Last Pain:  Vitals:   10/10/20 0732  TempSrc:   PainSc: 0-No pain                 Brett Canales Peggye Poon

## 2021-01-29 ENCOUNTER — Other Ambulatory Visit: Payer: Self-pay | Admitting: Family Medicine

## 2021-01-29 DIAGNOSIS — Z1231 Encounter for screening mammogram for malignant neoplasm of breast: Secondary | ICD-10-CM

## 2021-02-06 ENCOUNTER — Ambulatory Visit
Admission: RE | Admit: 2021-02-06 | Discharge: 2021-02-06 | Disposition: A | Discharge status: Home / Self Care | Payer: BLUE CROSS/BLUE SHIELD | Source: Ambulatory Visit | Attending: Family Medicine | Admitting: Family Medicine

## 2021-02-06 ENCOUNTER — Other Ambulatory Visit: Payer: Self-pay

## 2021-02-06 DIAGNOSIS — Z1231 Encounter for screening mammogram for malignant neoplasm of breast: Secondary | ICD-10-CM | POA: Insufficient documentation

## 2021-03-26 ENCOUNTER — Other Ambulatory Visit: Payer: Self-pay

## 2021-03-26 ENCOUNTER — Ambulatory Visit
Admission: EM | Admit: 2021-03-26 | Discharge: 2021-03-26 | Disposition: A | Discharge status: Home / Self Care | Payer: BLUE CROSS/BLUE SHIELD | Attending: Family Medicine | Admitting: Family Medicine

## 2021-03-26 DIAGNOSIS — J029 Acute pharyngitis, unspecified: Secondary | ICD-10-CM | POA: Insufficient documentation

## 2021-03-26 LAB — GROUP A STREP BY PCR: Group A Strep by PCR: NOT DETECTED

## 2021-03-26 MED ORDER — LIDOCAINE VISCOUS HCL 2 % MT SOLN: OROMUCOSAL | 0 refills | Status: AC

## 2021-03-26 MED ORDER — NAPROXEN 500 MG PO TABS: 500.0000 mg | ORAL_TABLET | Freq: Two times a day (BID) | ORAL | 0 refills | Status: AC | PRN

## 2021-03-26 NOTE — ED Provider Notes (Signed)
[facility]    CSN: [id number] Arrival date & time: 03/26/21  7209      History   Chief Complaint Chief Complaint  Patient presents with  . Sore Throat   HPI  55 year old female presents with tightness in her throat.  Started abruptly last night.  Patient reports that she has some pain with swallowing and feels like her throat is "closing up".  She reports associated fatigue.  She states that she had one episode of emesis when she tried to eat and drink something.  No fever.  No cough.  Patient does sound slightly hoarse.  No other reported symptoms.  No relieving factors.  No other complaints.  Past Medical History:  Diagnosis Date  . Diabetes mellitus without complication ([place])   . Dysrhythmia    SVT  . Hyperlipidemia   . Hypothyroidism   . Thyroid disease     There are no problems to display for this patient.   Past Surgical History:  Procedure Laterality Date  . ABDOMINAL HYSTERECTOMY    . BLADDER SURGERY    . COLONOSCOPY WITH PROPOFOL N/A 10/09/2020   Procedure: COLONOSCOPY WITH PROPOFOL;  Surgeon: Toledo, Benay Pike, MD;  Location: [facility] ENDOSCOPY;  Service: Gastroenterology;  Laterality: N/A;  . SVT ABLATION  04/10/2020    OB History   No obstetric history on file.      Home Medications    Prior to Admission medications   Medication Sig Start Date End Date Taking? Authorizing Provider  albuterol (VENTOLIN HFA) 108 (90 Base) MCG/ACT inhaler Inhale 2 puffs into the lungs every 6 (six) hours as needed for wheezing or shortness of breath. 08/04/20  Yes Lamptey, Myrene Galas, MD  Empagliflozin (JARDIANCE PO) Take by mouth.   Yes [provider]  fluticasone (FLONASE) 50 MCG/ACT nasal spray Place 2 sprays into both nostrils daily.   Yes [provider]  glipiZIDE (GLUCOTROL) 10 MG tablet Take 10 mg by mouth daily before breakfast.   Yes [provider]  levothyroxine (SYNTHROID, LEVOTHROID) 100 MCG tablet Take 100 mcg by mouth  daily before breakfast.   Yes [provider]  lidocaine (XYLOCAINE) 2 % solution Gargle 15 mL every 3 hours as needed. May swallow if desired. 03/26/21  Yes Reylynn Vanalstine G, DO  metFORMIN (GLUCOPHAGE) 1000 MG tablet Take 1,000 mg by mouth 2 (two) times daily with a meal.   Yes [provider]  naproxen (NAPROSYN) 500 MG tablet Take 1 tablet (500 mg total) by mouth 2 (two) times daily as needed for moderate pain. 03/26/21  Yes Novice Vrba G, DO  simvastatin (ZOCOR) 10 MG tablet Take 10 mg by mouth daily.   Yes [provider]  sitaGLIPtin (JANUVIA) 100 MG tablet Take 100 mg by mouth daily.   Yes [provider]    Family History Family History  Problem Relation Age of Onset  . Diabetes Mother   . Diabetes Father   . Hypertension Father   . Breast cancer Neg Hx     Social History Social History   Tobacco Use  . Smoking status: Never Smoker  . Smokeless tobacco: Never Used  Vaping Use  . Vaping Use: Never used  Substance Use Topics  . Alcohol use: No  . Drug use: No     Allergies   Sesame seed (diagnostic)   Review of Systems Review of Systems Per HPI  Physical Exam Triage Vital Signs ED Triage Vitals  Enc Vitals Group  BP 03/26/21 0937 114/81     Pulse Rate 03/26/21 0937 (!) 103     Resp 03/26/21 0937 19     Temp 03/26/21 0937 98.5 F (36.9 C)     Temp Source 03/26/21 0937 Oral     SpO2 03/26/21 0937 97 %     Weight 03/26/21 0936 195 lb (88.5 kg)     Height 03/26/21 0936 5\' 7"  (1.702 m)     Head Circumference --      Peak Flow --      Pain Score 03/26/21 0936 0     Pain Loc --      Pain Edu? --      Excl. in [place]? --    Updated Vital Signs BP 114/81 (BP Location: Left Arm)   Pulse (!) 103   Temp 98.5 F (36.9 C) (Oral)   Resp 19   Ht 5\' 7"  (1.702 m)   Wt 88.5 kg   SpO2 97%   BMI 30.54 kg/m   Visual Acuity Right Eye Distance:   Left Eye Distance:   Bilateral Distance:    Right Eye Near:   Left Eye Near:     Bilateral Near:     Physical Exam Vitals and nursing note reviewed.  Constitutional:      General: She is not in acute distress.    Appearance: Normal appearance. She is not ill-appearing.  HENT:     Head: Normocephalic and atraumatic.     Right Ear: Tympanic membrane normal.     Left Ear: Tympanic membrane normal.     Mouth/Throat:     Pharynx: Posterior oropharyngeal erythema present.  Eyes:     General:        Right eye: No discharge.        Left eye: No discharge.     Conjunctiva/sclera: Conjunctivae normal.  Cardiovascular:     Rate and Rhythm: Normal rate and regular rhythm.     Heart sounds: No murmur heard.   Pulmonary:     Effort: Pulmonary effort is normal.     Breath sounds: Normal breath sounds. No wheezing, rhonchi or rales.  Neurological:     Mental Status: She is alert.  Psychiatric:        Mood and Affect: Mood normal.        Behavior: Behavior normal.    UC Treatments / Results  Labs (all labs ordered are listed, but only abnormal results are displayed) Labs Reviewed  GROUP A STREP BY PCR    EKG   Radiology No results found.  Procedures Procedures (including critical care time)  Medications Ordered in UC Medications - No data to display  Initial Impression / Assessment and Plan / UC Course  I have reviewed the triage vital signs and the nursing notes.  Pertinent labs & imaging results that were available during my care of the patient were reviewed by me and considered in my medical decision making (see chart for details).    55 year old female presents with pharyngitis.  I suspect that this is viral in origin.  There is no evidence of peritonsillar abscess.  No appreciable thrush.  Awaiting strep PCR.  Viscous lidocaine and naproxen as directed.  Push fluids.  Supportive care.  Final Clinical Impressions(s) / UC Diagnoses   Final diagnoses:  Acute pharyngitis, unspecified etiology     Discharge Instructions     Rest, lots of  fluids.  Medication as prescribed to aid in the sore throat.  Awaiting Strep  test results.  Take care  Dr. Lacinda Axon    ED Prescriptions    Medication Sig Dispense Auth. Provider   lidocaine (XYLOCAINE) 2 % solution Gargle 15 mL every 3 hours as needed. May swallow if desired. 200 mL Yves Fodor G, DO   naproxen (NAPROSYN) 500 MG tablet Take 1 tablet (500 mg total) by mouth 2 (two) times daily as needed for moderate pain. 30 tablet Coral Spikes, DO     PDMP not reviewed this encounter.   Coral Spikes, DO 03/26/21 1021

## 2021-03-26 NOTE — ED Triage Notes (Signed)
Patient states that she has tightness in her throat, denies soreness. Patient has a red throat. States that she has felt tired today but states that she did not sleep well last night.

## 2021-03-26 NOTE — Discharge Instructions (Signed)
Rest, lots of fluids.  Medication as prescribed to aid in the sore throat.  Awaiting Strep test results.  Take care  Dr. Lacinda Axon

## 2021-04-16 ENCOUNTER — Other Ambulatory Visit: Payer: Self-pay | Admitting: Family Medicine

## 2021-06-18 ENCOUNTER — Other Ambulatory Visit: Payer: Self-pay

## 2021-06-18 ENCOUNTER — Ambulatory Visit
Admission: RE | Admit: 2021-06-18 | Discharge: 2021-06-18 | Disposition: A | Discharge status: Home / Self Care | Payer: BLUE CROSS/BLUE SHIELD

## 2021-06-18 ENCOUNTER — Ambulatory Visit
Admission: RE | Admit: 2021-06-18 | Discharge: 2021-06-18 | Disposition: A | Discharge status: Home / Self Care | Payer: BLUE CROSS/BLUE SHIELD | Source: Ambulatory Visit

## 2021-06-18 DIAGNOSIS — R059 Cough, unspecified: Secondary | ICD-10-CM

## 2021-06-18 DIAGNOSIS — J4530 Mild persistent asthma, uncomplicated: Secondary | ICD-10-CM | POA: Insufficient documentation

## 2021-06-21 ENCOUNTER — Other Ambulatory Visit: Payer: Self-pay

## 2021-06-21 DIAGNOSIS — R059 Cough, unspecified: Secondary | ICD-10-CM

## 2022-07-08 ENCOUNTER — Other Ambulatory Visit: Payer: Self-pay | Admitting: Student

## 2022-07-08 DIAGNOSIS — J324 Chronic pansinusitis: Secondary | ICD-10-CM

## 2022-07-12 ENCOUNTER — Ambulatory Visit
Admission: RE | Admit: 2022-07-12 | Discharge: 2022-07-12 | Disposition: A | Discharge status: Home / Self Care | Payer: Managed Care, Other (non HMO) | Source: Ambulatory Visit | Attending: Student | Admitting: Student

## 2022-07-12 ENCOUNTER — Ambulatory Visit: Payer: Managed Care, Other (non HMO)

## 2022-07-12 DIAGNOSIS — J324 Chronic pansinusitis: Secondary | ICD-10-CM

## 2022-08-04 ENCOUNTER — Ambulatory Visit: Payer: Managed Care, Other (non HMO)

## 2022-08-04 ENCOUNTER — Ambulatory Visit
Admission: EM | Admit: 2022-08-04 | Discharge: 2022-08-04 | Disposition: A | Discharge status: Home / Self Care | Payer: Managed Care, Other (non HMO) | Attending: Family Medicine | Admitting: Family Medicine

## 2022-08-04 DIAGNOSIS — S91341A Puncture wound with foreign body, right foot, initial encounter: Secondary | ICD-10-CM | POA: Diagnosis not present

## 2022-08-04 DIAGNOSIS — S90851A Superficial foreign body, right foot, initial encounter: Secondary | ICD-10-CM

## 2022-08-04 DIAGNOSIS — Z23 Encounter for immunization: Secondary | ICD-10-CM

## 2022-08-04 MED ORDER — TETANUS-DIPHTH-ACELL PERTUSSIS 5-2.5-18.5 LF-MCG/0.5 IM SUSY
0.5000 mL | PREFILLED_SYRINGE | Freq: Once | INTRAMUSCULAR | Status: AC
Start: 2022-08-04 — End: 2022-08-04
  Administered 2022-08-04: 0.5 mL via INTRAMUSCULAR

## 2022-08-04 NOTE — Discharge Instructions (Addendum)
As shown, you have a foreign body in your foot that is most likely causing your pain.  I placed a referral to [facility] in [place] who should call you to set up an appointment.

## 2022-08-04 NOTE — ED Provider Notes (Signed)
[facility]    CSN: [id number] Arrival date & time: 08/04/22  1109      History   Chief Complaint Chief Complaint  Patient presents with   Foot Injury    Right      HPI Donna Strickland is a 56 y.o. female.   HPI  Donna Strickland was walking around the mall yesterday and something went into her foot.  She did not see what the object was.  She did not pull anything out of her foot.  She started having immediate pain.  Pain has progressively gotten worse as it is spreading up her leg.  Denies any fever nausea or vomiting.  She slept more than she does usually but notes that she has been taking care of her ill mother and may have been more tired.  She is unsure of when her last tetanus shot was.    Fever : no  Sore throat: no   Cough: no Appetite: normal  Hydration: normal  Abdominal pain: no Nausea: no Vomiting: no Back Pain: no Headache: no     Past Medical History:  Diagnosis Date   Diabetes mellitus without complication ([place])    Dysrhythmia    SVT   Hyperlipidemia    Hypothyroidism    Thyroid disease     There are no problems to display for this patient.   Past Surgical History:  Procedure Laterality Date   ABDOMINAL HYSTERECTOMY     BLADDER SURGERY     COLONOSCOPY WITH PROPOFOL N/A 10/09/2020   Procedure: COLONOSCOPY WITH PROPOFOL;  Surgeon: Toledo, Benay Pike, MD;  Location: [facility] ENDOSCOPY;  Service: Gastroenterology;  Laterality: N/A;   SVT ABLATION  04/10/2020    OB History   No obstetric history on file.      Home Medications    Prior to Admission medications   Medication Sig Start Date End Date Taking? Authorizing Provider  albuterol (VENTOLIN HFA) 108 (90 Base) MCG/ACT inhaler Inhale 2 puffs into the lungs every 6 (six) hours as needed for wheezing or shortness of breath. 08/04/20   LampteyMyrene Galas, MD  Empagliflozin (JARDIANCE PO) Take by mouth.    [provider]  fluticasone (FLONASE) 50 MCG/ACT nasal spray Place 2  sprays into both nostrils daily.    [provider]  glipiZIDE (GLUCOTROL) 10 MG tablet Take 10 mg by mouth daily before breakfast.    [provider]  levothyroxine (SYNTHROID, LEVOTHROID) 100 MCG tablet Take 100 mcg by mouth daily before breakfast.    [provider]  lidocaine (XYLOCAINE) 2 % solution Gargle 15 mL every 3 hours as needed. May swallow if desired. 03/26/21   Coral Spikes, DO  metFORMIN (GLUCOPHAGE) 1000 MG tablet Take 1,000 mg by mouth 2 (two) times daily with a meal.    [provider]  naproxen (NAPROSYN) 500 MG tablet Take 1 tablet (500 mg total) by mouth 2 (two) times daily as needed for moderate pain. 03/26/21   Coral Spikes, DO  simvastatin (ZOCOR) 10 MG tablet Take 10 mg by mouth daily.    [provider]  sitaGLIPtin (JANUVIA) 100 MG tablet Take 100 mg by mouth daily.    [provider]    Family History Family History  Problem Relation Age of Onset   Diabetes Mother    Diabetes Father    Hypertension Father    Breast cancer Neg Hx     Social History Social History   Tobacco Use   Smoking status:  Never   Smokeless tobacco: Never  Vaping Use   Vaping Use: Never used  Substance Use Topics   Alcohol use: No   Drug use: No     Allergies   Doxycycline and Sesame seed (diagnostic)   Review of Systems Review of Systems: :negative unless otherwise stated in HPI.      Physical Exam Triage Vital Signs ED Triage Vitals  Enc Vitals Group     BP 08/04/22 1122 126/75     Pulse Rate 08/04/22 1122 (!) 105     Resp --      Temp 08/04/22 1122 98.5 F (36.9 C)     Temp Source 08/04/22 1122 Oral     SpO2 08/04/22 1122 98 %     Weight 08/04/22 1120 185 lb (83.9 kg)     Height 08/04/22 1120 '5\' 7"'$  (1.702 m)     Head Circumference --      Peak Flow --      Pain Score 08/04/22 1119 8     Pain Loc --      Pain Edu? --      Excl. in [place]? --    No data found.  Updated Vital Signs BP 126/75 (BP Location:  Left Arm)   Pulse (!) 105   Temp 98.5 F (36.9 C) (Oral)   Ht '5\' 7"'$  (1.702 m)   Wt 83.9 kg   SpO2 98%   BMI 28.98 kg/m   Visual Acuity Right Eye Distance:   Left Eye Distance:   Bilateral Distance:    Right Eye Near:   Left Eye Near:    Bilateral Near:     Physical Exam GEN: well appearing female in no acute distress  CVS: well perfused, regular rate RESP: speaking in full sentences without pause, no respiratory distress  MSK:  Right foot:  No evidence of bony deformity, asymmetry, or muscle atrophy. No tenderness over bilateral malleoli or base of the fifth metatarsal Good range of motion of bilateral toes, pain with movement of right toes however Full active and passive (ABD, ADD, Flexion, extension, inversion and eversion).  Strength 5/5  Sensation intact. Peripheral pulses intact.   UC Treatments / Results  Labs (all labs ordered are listed, but only abnormal results are displayed) Labs Reviewed - No data to display  EKG   Radiology DG Foot Complete Right  Result Date: 08/04/2022 CLINICAL DATA:  Puncture wound EXAM: RIGHT FOOT COMPLETE - 3+ VIEW COMPARISON:  None Available. FINDINGS: There is no evidence of fracture or dislocation. There is no evidence of arthropathy or other focal bone abnormality. Linear metallic 18 mm foreign body within the deep soft tissues of the right foot which appears to be located dorsal to the level of the fourth metatarsal neck. Foreign body has a small eyelet proximally and may represent a piece of a sewing needle. No additional radiopaque foreign bodies are seen. No focal soft tissue swelling or soft tissue gas. IMPRESSION: 1. Linear metallic 18 mm foreign body within the deep soft tissues of the right foot which appears to be located dorsal to the level of the fourth metatarsal neck. Foreign body has a small eyelet proximally and may represent a piece of a sewing needle. 2. No acute osseous abnormality. Electronically Signed   By:  Davina Poke D.O.   On: 08/04/2022 12:03    Procedures Procedures (including critical care time)  Medications Ordered in UC Medications  Tdap (BOOSTRIX) injection 0.5 mL (0.5 mLs Intramuscular Given 08/04/22 1151)  Initial Impression / Assessment and Plan / UC Course  I have reviewed the triage vital signs and the nursing notes.  Pertinent labs & imaging results that were available during my care of the patient were reviewed by me and considered in my medical decision making (see chart for details).      Pt is a 56 y.o.  female with 2 days of right foot pain has been progressively worsening.  Obtain right foot x-ray that showed a needlelike foreign body, which was personally reviewed by me.  Given the location it does not look superficial enough for me to retrieve it.  Showed patient and her husband the x-rays.  Over-the-counter analgesics as needed for pain.  Tetanus was given as her last tetanus was in November 2012. Advised patient to follow-up with podiatry for further evaluation.  Referral placed.  Work note provided.  Discussed MDM, treatment plan and plan for follow-up with patient/parent who agrees with plan.   Final Clinical Impressions(s) / UC Diagnoses   Final diagnoses:  Foreign body in right foot, initial encounter     Discharge Instructions      As shown, you have a foreign body in your foot that is most likely causing your pain.  I placed a referral to [facility] in [place] who should call you to set up an appointment.      ED Prescriptions   None    PDMP not reviewed this encounter.   Lyndee Hensen, DO 08/04/22 1235

## 2022-08-04 NOTE — ED Triage Notes (Addendum)
Patient reports that she stepped on something yesterday. Patient is unsure what she stepped on.   Patient report that her right foot hurts and it is traveling into her toes.   Patient reports that she is a diabetic.   Last Tdap 11/08/2011

## 2022-08-05 ENCOUNTER — Telehealth: Payer: Self-pay

## 2022-08-05 ENCOUNTER — Ambulatory Visit (INDEPENDENT_AMBULATORY_CARE_PROVIDER_SITE_OTHER): Payer: Managed Care, Other (non HMO)

## 2022-08-05 ENCOUNTER — Ambulatory Visit (INDEPENDENT_AMBULATORY_CARE_PROVIDER_SITE_OTHER): Payer: Managed Care, Other (non HMO) | Admitting: Podiatry

## 2022-08-05 DIAGNOSIS — S90851A Superficial foreign body, right foot, initial encounter: Secondary | ICD-10-CM

## 2022-08-05 DIAGNOSIS — S99921A Unspecified injury of right foot, initial encounter: Secondary | ICD-10-CM | POA: Diagnosis not present

## 2022-08-05 MED ORDER — OXYCODONE-ACETAMINOPHEN 5-325 MG PO TABS: 1.0000 | ORAL_TABLET | Freq: Four times a day (QID) | ORAL | 0 refills | Status: DC | PRN

## 2022-08-05 MED ORDER — CEPHALEXIN 500 MG PO CAPS: 500.0000 mg | ORAL_CAPSULE | Freq: Four times a day (QID) | ORAL | 0 refills | Status: AC

## 2022-08-05 NOTE — Progress Notes (Unsigned)
Subjective:   Patient ID: Donna Strickland, female   DOB: 56 y.o.   MRN: [id number]   HPI Chief Complaint  Patient presents with   Diabetes    Foreign body in right foot - diabetic, Patient feel she may have stepped on something, varies in intensity, pain is a 10 today, walking triggers pain, pain starts in the ball of right foot and radiates to the right mid lateral side.    56 year old female with above complaints.  She said that over the weekend she stepped on something that she has had pain to her foot.  She was seen in urgent care was told she had a foreign body.  She states that she was not put on any antibiotics no pain medication.  Area is tender to walk.  T[place]anus updated in the urgent care.   Review of Systems  All other systems reviewed and are negative.  Past Medical History:  Diagnosis Date   Diabetes mellitus without complication ([place])    Dysrhythmia    SVT   Hyperlipidemia    Hypothyroidism    Thyroid disease     Past Surgical History:  Procedure Laterality Date   ABDOMINAL HYSTERECTOMY     BLADDER SURGERY     COLONOSCOPY WITH PROPOFOL N/A 10/09/2020   Procedure: COLONOSCOPY WITH PROPOFOL;  Surgeon: Toledo, Benay Pike, MD;  Location: [facility] ENDOSCOPY;  Service: Gastroenterology;  Laterality: N/A;   SVT ABLATION  04/10/2020     Current Outpatient Medications:    albuterol (VENTOLIN HFA) 108 (90 Base) MCG/ACT inhaler, Inhale 2 puffs into the lungs every 6 (six) hours as needed for wheezing or shortness of breath., Disp: 18 g, Rfl: 0   cephALEXin (KEFLEX) 500 MG capsule, Take 1 capsule (500 mg total) by mouth 4 (four) times daily., Disp: 40 capsule, Rfl: 0   Empagliflozin (JARDIANCE PO), Take by mouth., Disp: , Rfl:    fluticasone (FLONASE) 50 MCG/ACT nasal spray, Place 2 sprays into both nostrils daily., Disp: , Rfl:    glipiZIDE (GLUCOTROL) 10 MG tablet, Take 10 mg by mouth daily before breakfast., Disp: , Rfl:    levothyroxine (SYNTHROID, LEVOTHROID) 100 MCG  tablet, Take 100 mcg by mouth daily before breakfast., Disp: , Rfl:    lidocaine (XYLOCAINE) 2 % solution, Gargle 15 mL every 3 hours as needed. May swallow if desired., Disp: 200 mL, Rfl: 0   metFORMIN (GLUCOPHAGE) 1000 MG tablet, Take 1,000 mg by mouth 2 (two) times daily with a meal., Disp: , Rfl:    naproxen (NAPROSYN) 500 MG tablet, Take 1 tablet (500 mg total) by mouth 2 (two) times daily as needed for moderate pain., Disp: 30 tablet, Rfl: 0   simvastatin (ZOCOR) 10 MG tablet, Take 10 mg by mouth daily., Disp: , Rfl:    sitaGLIPtin (JANUVIA) 100 MG tablet, Take 100 mg by mouth daily., Disp: , Rfl:    acetaminophen-codeine (TYLENOL #3) 300-30 MG tablet, Take 1-2 tablets by mouth every 6 (six) hours as needed for moderate pain., Disp: 15 tablet, Rfl: 0  Allergies  Allergen Reactions   Doxycycline Anaphylaxis and Hives   Sesame Seed (Diagnostic) Itching          Objective:  Physical Exam  General: AAO x3, NAD  Dermatological: There is a puncture wound on the plantar aspect submetatarsal 4 on the right foot.  There is no drainage or pus but there is surrounding erythema with mild streaking present.  There is no fluctuance or crepitation.  There is no malodor.  Vascular: Dorsalis Pedis artery and Posterior Tibial artery pedal pulses are 2/4 bilateral with immedate capillary fill time.  There is no pain with calf compression, swelling, warmth, erythema.   Neruologic: Grossly intact via light touch bilateral.   Musculoskeletal: Tenderness on the area of the puncture wound, foreign body.  Muscular strength 5/5 in all groups tested bilateral.  Gait: Unassisted, Nonantalgic.       Assessment:   Residual foreign body, localized infection     Plan:  -Treatment options discussed including all alternatives, risks, and complications -Etiology of symptoms were discussed -Reviewed the prior x-rays.  Given how deep this I do recommend surgical debridement, removal of foreign body.  I  try to do this today however there is no availability.  Discussed admission to the hospital but she wanted to try this as an outpatient.  We will plan on doing an I&D, foreign body removal tomorrow. -The incision placement as well as the postoperative course was discussed with the patient. I discussed risks of the surgery which include, but not limited to, infection, bleeding, pain, swelling, need for further surgery, delayed or nonhealing, painful or ugly scar, numbness or sensation changes, unable to remove foreign body, DVT/PE, loss of toe/foot. Patient understands these risks and wishes to proceed with surgery. The surgical consent was reviewed with the patient all 3 pages were signed. No promises or guarantees were given to the outcome of the procedure. All questions were answered to the best of my ability. Before the surgery the patient was encouraged to call the office if there is any further questions. The surgery will be performed at the [facility] on an outpatient basis. -Keflex -Percocet for pain -CAM boot dispensed for immobilization  Trula Slade DPM

## 2022-08-05 NOTE — Telephone Encounter (Signed)
DOS 08/06/2022  I & D AND REMOVAL OF FOREIGN BODY RT - 10121  [employer] INS[employer]ANCE  PER AUTOMATED SYSTEM - NO PRECERT REQUIRED FOR CPT 10121 CONF# [phone]

## 2022-08-06 ENCOUNTER — Encounter: Payer: Self-pay | Admitting: Podiatry

## 2022-08-06 ENCOUNTER — Other Ambulatory Visit: Payer: Self-pay | Admitting: Podiatry

## 2022-08-06 DIAGNOSIS — S90851A Superficial foreign body, right foot, initial encounter: Secondary | ICD-10-CM

## 2022-08-06 MED ORDER — ACETAMINOPHEN-CODEINE 300-30 MG PO TABS: 1.0000 | ORAL_TABLET | Freq: Four times a day (QID) | ORAL | 0 refills | Status: AC | PRN

## 2022-08-06 NOTE — Progress Notes (Signed)
Tylenol #3 ordered. She has had this before and could not tolerate the Percocet due to itching.

## 2022-08-07 ENCOUNTER — Encounter: Payer: Self-pay | Admitting: Podiatry

## 2022-08-07 ENCOUNTER — Telehealth: Payer: Self-pay | Admitting: Podiatry

## 2022-08-07 NOTE — Telephone Encounter (Signed)
Patient called the answering service on 8/15 in the evening stating that she has been having a lot of pain since the surgery. Advised her to go back to the percocet but take a benadryl as well to help with the itching.  Encouraged ice and elevate as well.  Discussed that she can loosen the boot as well as the bandage if needed.  Patient sent me a text on 8/16 she states that she tried to call the office but could not get a hold of anybody.  I called her back.  Still having pain.  Discussed taking the Percocet 1 tablet every 4 hours or 2 every 6 hours on a regular basis and continue ice, elevate.  If needed have from the office for evaluation. Denies any fevers, chills or other systemic symptoms.  I have spoken with Levada Dy in our office and that she is working on [employer].  Celesta Gentile, DPM

## 2022-08-08 NOTE — Telephone Encounter (Signed)
Patient called to get schedule for a sooner appointment due to her bandages being too tight. Added to the nurse schedule

## 2022-08-09 ENCOUNTER — Ambulatory Visit (INDEPENDENT_AMBULATORY_CARE_PROVIDER_SITE_OTHER): Payer: Managed Care, Other (non HMO)

## 2022-08-09 ENCOUNTER — Telehealth: Payer: Self-pay | Admitting: *Deleted

## 2022-08-09 DIAGNOSIS — S90851A Superficial foreign body, right foot, initial encounter: Secondary | ICD-10-CM

## 2022-08-09 MED ORDER — OXYCODONE-ACETAMINOPHEN 5-325 MG PO TABS: 1.0000 | ORAL_TABLET | Freq: Four times a day (QID) | ORAL | 0 refills | Status: AC | PRN

## 2022-08-09 NOTE — Telephone Encounter (Signed)
Patient calling for status of pain medicine that was supposed to be sent to pharmacy, not there.   Called pharmacy, prescription is there and being processed, notified patient.

## 2022-08-09 NOTE — Progress Notes (Signed)
POV # DOS 08/06/2022 RT FOOT INCISION & DRAINAGE, REMOVAL OF FOREIGN BODY  Per Dr. Leigh Aurora request Dressing changed with a DSD with xeroform placed over both incisions. Patient denies nausea, vomiting, fever and chills today.   Advised patient to continue to monitor for signs and symptoms of infection. She was advised to call the office or go to the ED if it is after hours if signs and symptoms develop. Patient verbalized understanding.

## 2022-08-12 ENCOUNTER — Other Ambulatory Visit: Payer: Managed Care, Other (non HMO)

## 2022-08-14 ENCOUNTER — Ambulatory Visit (INDEPENDENT_AMBULATORY_CARE_PROVIDER_SITE_OTHER): Payer: Managed Care, Other (non HMO)

## 2022-08-14 DIAGNOSIS — S90851A Superficial foreign body, right foot, initial encounter: Secondary | ICD-10-CM

## 2022-08-14 NOTE — Progress Notes (Signed)
POV # DOS 08/06/2022 RT FOOT INCISION & DRAINAGE, REMOVAL OF FOREIGN BODY  Xray's were obtained. Sutures are intact. Patient stated her pain is decreasing and is only taking medication as needed for pain.  Dressing changed with a DSD with xeroform placed over both incisions. Patient denies nausea, vomiting, fever and chills today.    Advised patient to continue to monitor for signs and symptoms of infection. She was advised to call the office or go to the ED if it is after hours if signs and symptoms develop. Patient verbalized understanding.   Patient will follow up with provider on the next visit.

## 2022-08-22 ENCOUNTER — Encounter: Payer: Self-pay | Admitting: Podiatry

## 2022-08-22 ENCOUNTER — Ambulatory Visit (INDEPENDENT_AMBULATORY_CARE_PROVIDER_SITE_OTHER): Payer: Managed Care, Other (non HMO) | Admitting: Podiatry

## 2022-08-22 DIAGNOSIS — S90851A Superficial foreign body, right foot, initial encounter: Secondary | ICD-10-CM

## 2022-08-22 DIAGNOSIS — S90851D Superficial foreign body, right foot, subsequent encounter: Secondary | ICD-10-CM

## 2022-08-22 NOTE — Progress Notes (Signed)
Subjective: Chief Complaint  Patient presents with   Routine Post Op    POV #3 DOS 08/06/2022 RT FOOT INCISION & DRAINAGE, REMOVAL OF FOREIGN BODY  Patient has some soreness to the bottom of her right foot.    Donna Strickland is a 56 y.o. is seen today in office s/p right foot incision and drainage, foreign body removal preformed on 08/06/2022.  Pain is improving but having soreness on the bottom.  She presents today for possible suture removal.  Denies any fevers or chills.     Objective: General: No acute distress, AAOx3  DP/PT pulses palpable 2/4, CRT < 3 sec to all digits.  Protective sensation intact. Motor function intact.  Right foot: Incision is well coapted without any evidence of dehiscence and sutures are intact.  There is no surrounding erythema, ascending cellulitis, fluctuance, crepitus, malodor, drainage/purulence. There is decreased edema around the surgical site. There is mild pain along the surgical site.  The erythema is present prior to surgery has resolved. No other areas of tenderness to bilateral lower extremities.  No other open lesions or pre-ulcerative lesions.  No pain with calf compression, swelling, warmth, erythema.   Assessment and Plan:  Status post foreign body excision, I&D, doing well with no complications   -Treatment options discussed including all alternatives, risks, and complications -Sutures not quite ready to be removed total time intact.  I cleaned the incision antibiotic ointment and a bandage applied.  Discussed that she can wash it with soap and water, dry thoroughly apply a similar bandage. -Maintain boot -Ice/elevation -Pain medication as needed. -Monitor for any clinical signs or symptoms of infection and DVT/PE and directed to call the office immediately should any occur or go to the ER. -Follow-up as scheduled for suture removal or sooner if any problems arise. In the meantime, encouraged to call the office with any questions, concerns,  change in symptoms.   Celesta Gentile, DPM

## 2022-09-02 ENCOUNTER — Ambulatory Visit (INDEPENDENT_AMBULATORY_CARE_PROVIDER_SITE_OTHER): Payer: Managed Care, Other (non HMO) | Admitting: Podiatry

## 2022-09-02 DIAGNOSIS — S90851D Superficial foreign body, right foot, subsequent encounter: Secondary | ICD-10-CM

## 2022-09-05 ENCOUNTER — Encounter: Payer: Managed Care, Other (non HMO) | Admitting: Podiatry

## 2022-09-05 NOTE — Progress Notes (Signed)
Subjective: Chief Complaint  Patient presents with   Foot Injury    Right foot foreign body follow-up,     Donna Strickland is a 56 y.o. is seen today in office s/p right foot incision and drainage, foreign body removal preformed on 08/06/2022.  Presents today for suture removal.  Stated overall she is feeling better.  Since I saw her she ended up in the hospital due to reaction to [place].   Blood clots were ruled out given her recent foot surgery she reports.  Objective: General: No acute distress, AAOx3  DP/PT pulses palpable 2/4, CRT < 3 sec to all digits.  Protective sensation intact. Motor function intact.  Right foot: Incision is well coapted without any evidence of dehiscence and sutures are intact.  There is no surrounding erythema, ascending cellulitis, fluctuance, crepitus, malodor, drainage/purulence.  Slight edema to the surgical sites but overall improved.  There is mild pain along the surgical site.  The erythema is present prior to surgery has resolved. No other areas of tenderness to bilateral lower extremities.  No other open lesions or pre-ulcerative lesions.  No pain with calf compression, swelling, warmth, erythema.   Assessment and Plan:  Status post foreign body excision, I&D, doing well with no complications   -Treatment options discussed including all alternatives, risks, and complications -Sutures removed today without complications.  Stitches coapted.  Steri-Strips applied for reinforcement.  Discussed washing with soap and water daily, apply antibiotic ointment and a bandage.  I remain in the offloading boot for now but next week she can gradually transition to regular shoe as tolerated. -Ice/elevation -Pain medication as needed. -Monitor for any clinical signs or symptoms of infection and DVT/PE and directed to call the office immediately should any occur or go to the ER. -Follow-up as scheduled or sooner if any problems arise. In the meantime, encouraged to  call the office with any questions, concerns, change in symptoms.      Celesta Gentile, DPM

## 2022-09-16 ENCOUNTER — Ambulatory Visit (INDEPENDENT_AMBULATORY_CARE_PROVIDER_SITE_OTHER): Payer: Managed Care, Other (non HMO) | Admitting: Podiatry

## 2022-09-16 ENCOUNTER — Encounter: Payer: Self-pay | Admitting: Podiatry

## 2022-09-16 DIAGNOSIS — S90851D Superficial foreign body, right foot, subsequent encounter: Secondary | ICD-10-CM

## 2022-09-16 NOTE — Progress Notes (Unsigned)
Subjective: Chief Complaint  Patient presents with   Routine Post Op    POV# 4 Right foot Foreign body Removal, Rate of pain 3 out of 10, patient states there is dead skin coming off of the foot and it is making the foot sore,     Donna Strickland is a 56 y.o. is seen today in office s/p right foot incision and drainage, foreign body removal preformed on 08/06/2022.  She is back to her regular shoe to try to increase her walking.  She still is tenderness on the incision on the bottom.  She has noticed it becoming somewhat harder in consistency.  Objective: General: No acute distress, AAOx3  DP/PT pulses palpable 2/4, CRT < 3 sec to all digits.  Protective sensation intact. Motor function intact.  Right foot: Incision is well coapted without any evidence of dehiscence scar is formed.  Tenderness palpation on the plantar incision.  Some scar tissue is present.  There is no erythema or warmth there is minimal edema.  No significant tenderness to the dorsal incision. No other open lesions or pre-ulcerative lesions.  No pain with calf compression, swelling, warmth, erythema.   Assessment and Plan:  Status post foreign body excision, I&D, doing well with no complications   -Treatment options discussed including all alternatives, risks, and complications -Incisions healing well.  No signs of infection currently.  Discussed moisturizer on the incision daily.  Offloading pads were dispensed.  Gradual increase activity level as tolerated.  She is not able to wear a work boot at this time and therefore she will not able to return to work.  We will extend her out of work for 1 more month but when she is able to wear shoe we can return her to work. -Ice/elevation -Pain medication as needed. -Monitor for any clinical signs or symptoms of infection and DVT/PE and directed to call the office immediately should any occur or go to the ER. -Follow-up as scheduled or sooner if any problems arise. In the  meantime, encouraged to call the office with any questions, concerns, change in symptoms.   Celesta Gentile, DPM

## 2022-10-07 ENCOUNTER — Ambulatory Visit (INDEPENDENT_AMBULATORY_CARE_PROVIDER_SITE_OTHER): Payer: Managed Care, Other (non HMO) | Admitting: Podiatry

## 2022-10-07 DIAGNOSIS — M216X2 Other acquired deformities of left foot: Secondary | ICD-10-CM

## 2022-10-07 DIAGNOSIS — M21961 Unspecified acquired deformity of right lower leg: Secondary | ICD-10-CM

## 2022-10-07 DIAGNOSIS — M216X1 Other acquired deformities of right foot: Secondary | ICD-10-CM | POA: Diagnosis not present

## 2022-10-09 NOTE — Progress Notes (Signed)
Subjective: Chief Complaint  Patient presents with   Foreign Body    Right foot foreign body removal, patient has a callus on the forefoot, which is causing some pain, Diabetic A1c- 6.7 BG- 120 (yesterday)    Donna Strickland is a 56 y.o. is seen today in office s/p right foot incision and drainage, foreign body removal preformed on 08/06/2022.  States that she is doing better and she is back to wearing more regular shoes.  She still gets a knot on the bottom of the right foot that causes pain at times.  No open lesions.  No swelling or redness.  No new concerns.    Objective: General: No acute distress, AAOx3  DP/PT pulses palpable 2/4, CRT < 3 sec to all digits.  Protective sensation intact. Motor function intact.  Right foot: Incision is well coapted without any evidence of dehiscence scar is formed.  There is prominence of the metatarsal head on the right foot submetatarsal 4.  There is no edema, erythema.  No open lesions at this time.   No pain with calf compression, swelling, warmth, erythema.   Assessment and Plan:  Metatarsalgia/acquired deformity of right foot; s/p right foot foot I&D, foreign body  -Treatment options discussed including all alternatives, risks, and complications -Due to the prominent metatarsal heads and her history of diabetes I would recommend orthotics.  She was measured for inserts today particular to offload the prominent metatarsal head. -Daily foot inspection. -Monitor for any clinical signs or symptoms of infection and DVT/PE and directed to call the office immediately should any occur or go to the ER. -Follow-up as scheduled or sooner if any problems arise. In the meantime, encouraged to call the office with any questions, concerns, change in symptoms.   Celesta Gentile, DPM

## 2022-11-06 ENCOUNTER — Telehealth: Payer: Self-pay | Admitting: Podiatry

## 2022-11-06 NOTE — Telephone Encounter (Signed)
Left message on vm to call back to schedule appt to pick up orthotics - No Balance    

## 2022-12-26 NOTE — Telephone Encounter (Signed)
Left message on vm to call back to schedule appt to pick up orthotics - No Balance    

## 2023-02-05 IMAGING — MG MM DIGITAL SCREENING BILAT W/ TOMO AND CAD
8 series · 8 of 24 positions shown · non-contrast
Comparison: Previous exam(s).

CLINICAL DATA: Screening.

EXAM:
DIGITAL SCREENING BILATERAL MAMMOGRAM WITH TOMOSYNTHESIS AND CAD
TECHNIQUE: Bilateral screening digital craniocaudal and mediolateral oblique
mammograms were obtained. Bilateral screening digital breast
tomosynthesis was performed. The images were evaluated with
computer-aided detection.

[L CC synth-2D]
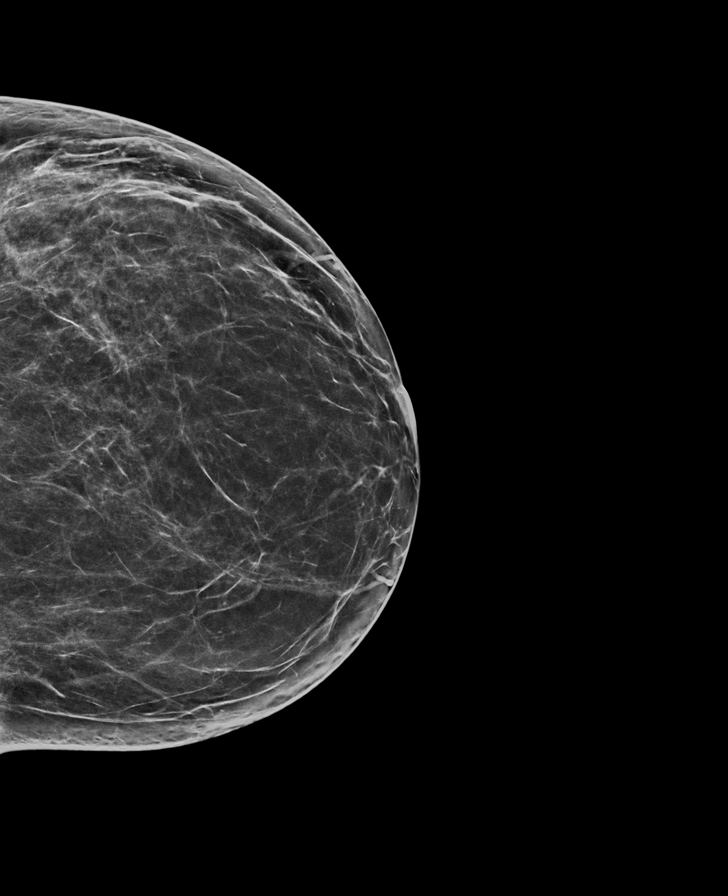

[R MLO synth-2D]
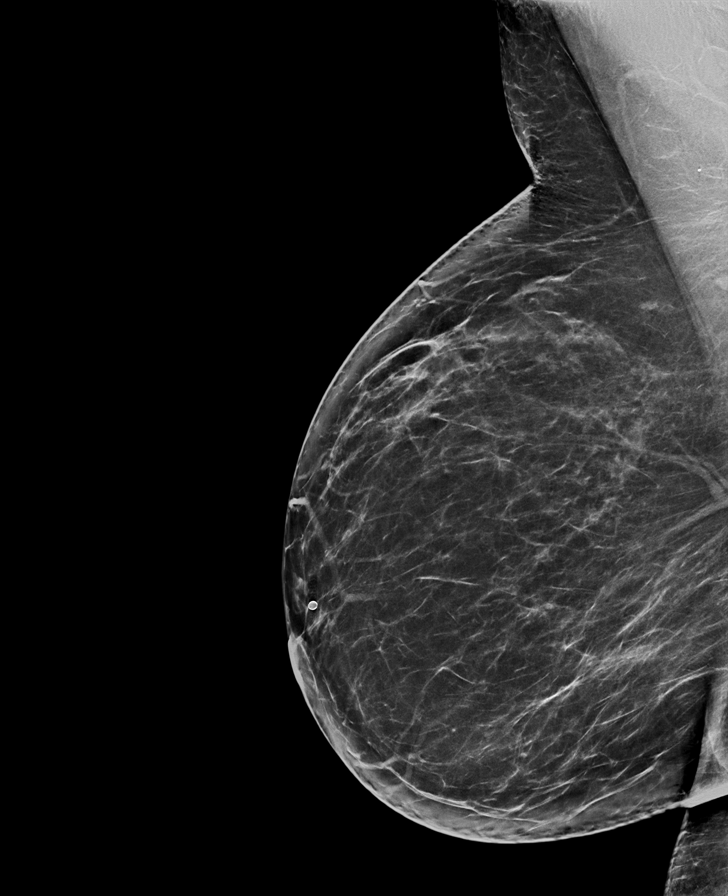

[R CC synth-2D]
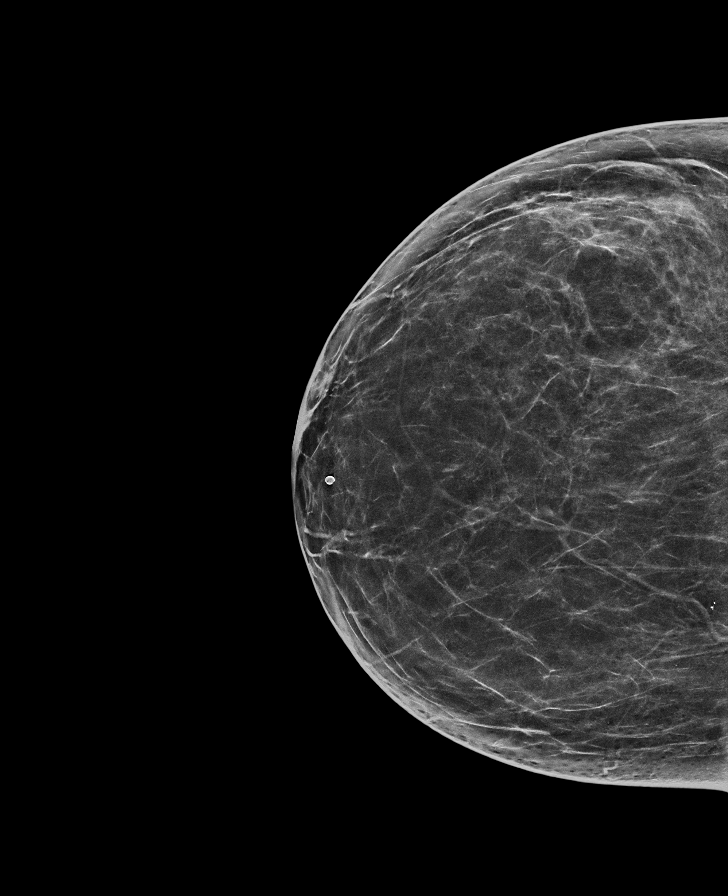

[L MLO synth-2D]
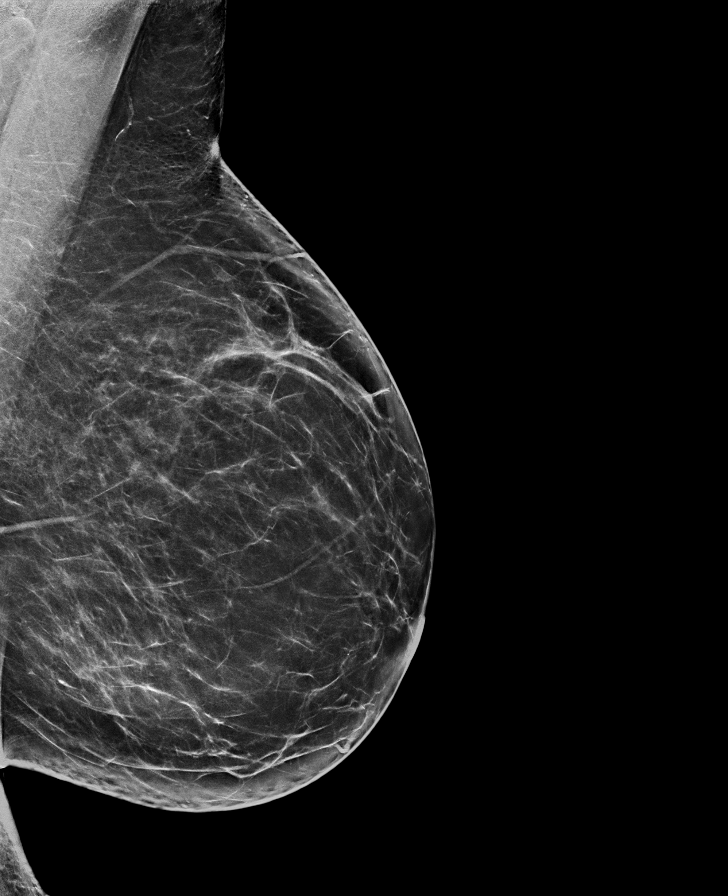

[R MLO tomo · tomo slice 41/82.0]
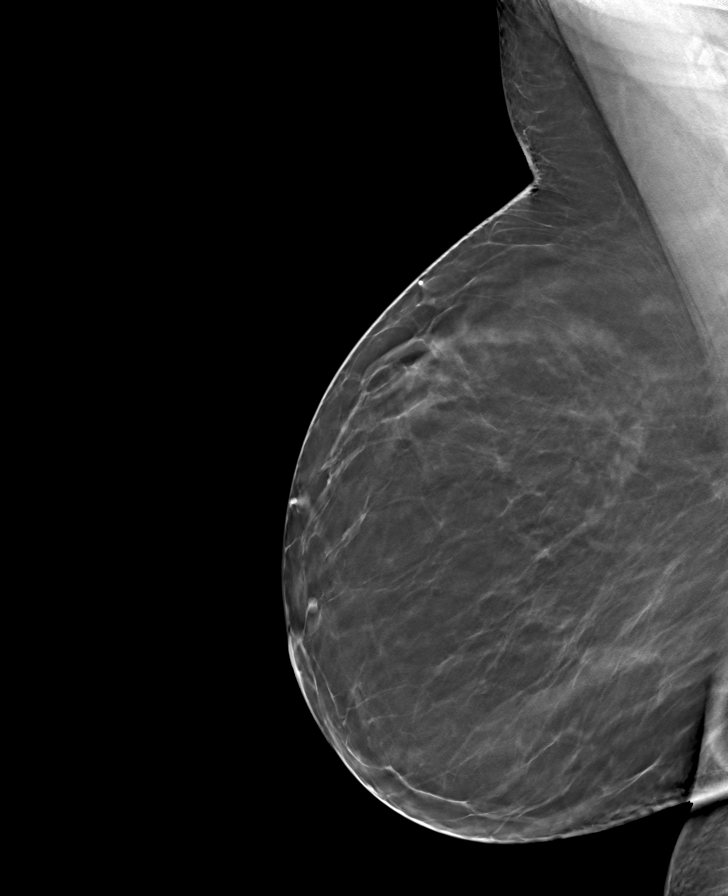

[L CC tomo · tomo slice 37/74.0]
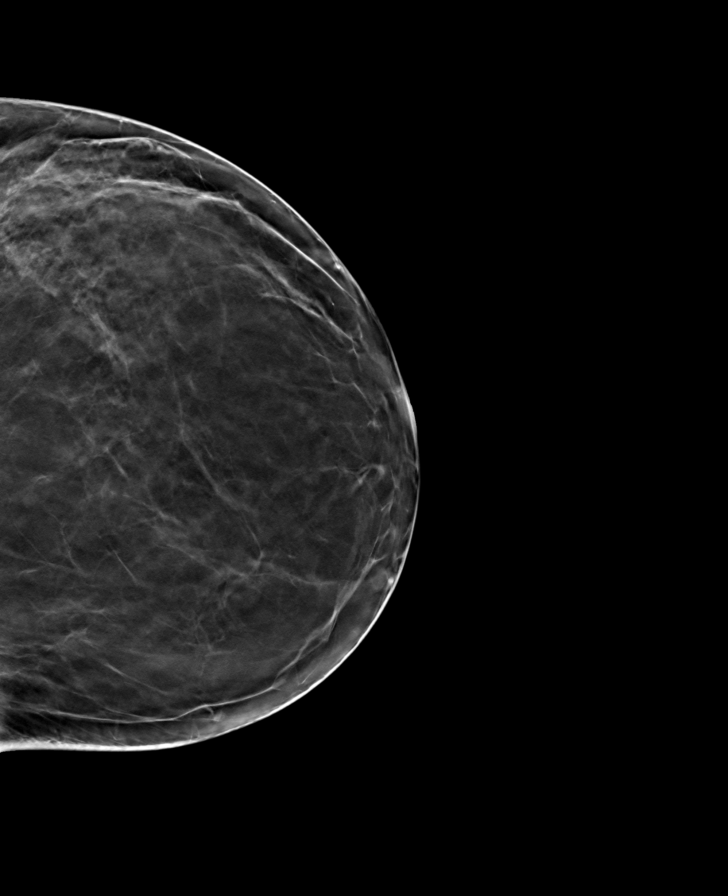

[L MLO tomo · tomo slice 41/82.0]
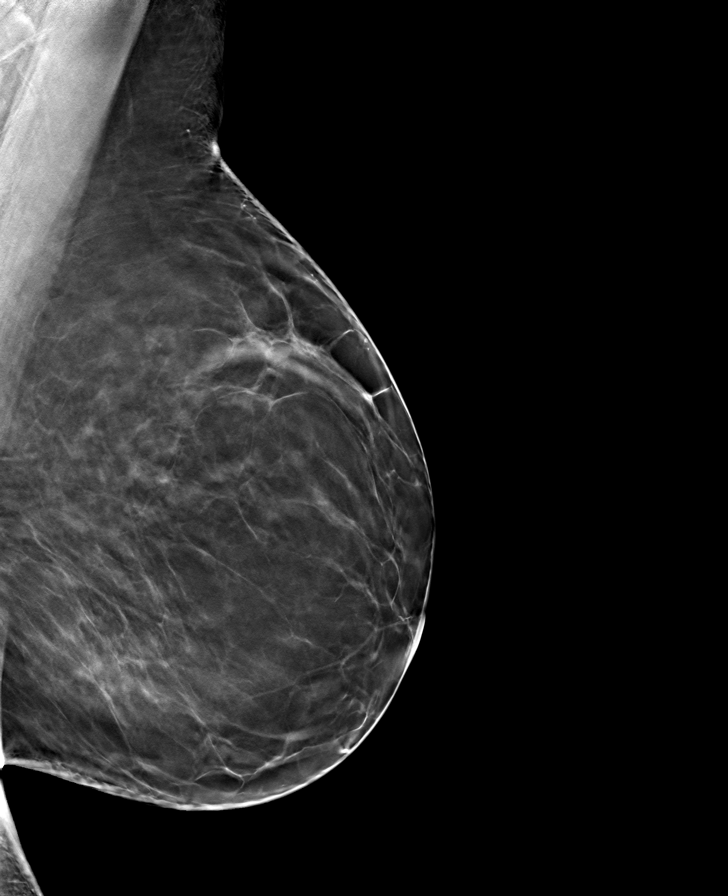

[R CC tomo · tomo slice 37/74.0]
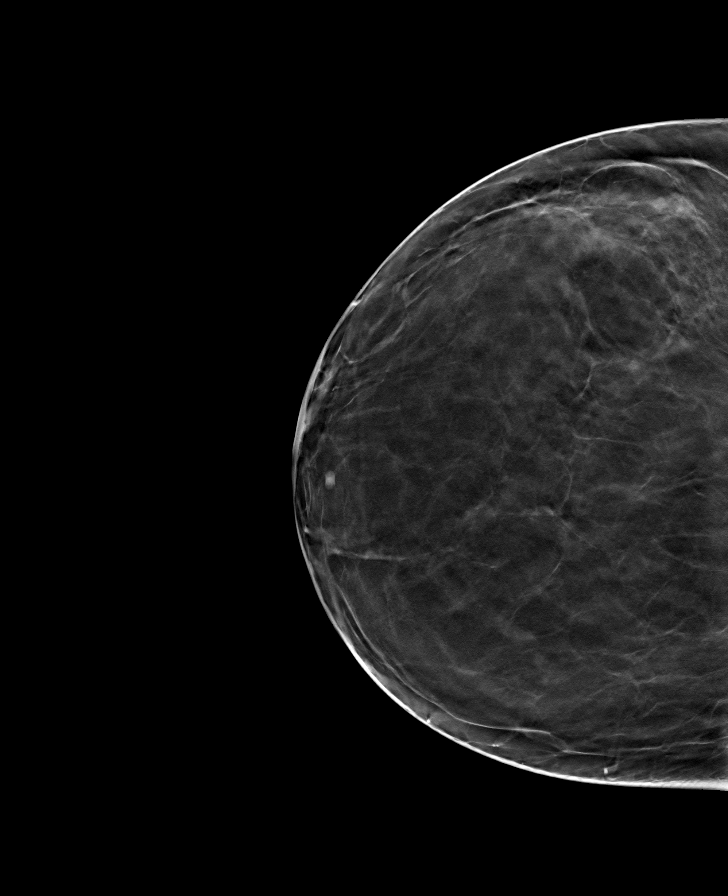

[8 of 24 positions shown; findings below may reference images not displayed]

ACR Breast Density Category b: There are scattered areas of
fibroglandular density.
FINDINGS: There are no findings suspicious for malignancy.
IMPRESSION: No mammographic evidence of malignancy. A result letter of this
screening mammogram will be mailed directly to the patient.

RECOMMENDATION:
Screening mammogram in one year. (Code:51-O-LD2)

BI-RADS CATEGORY  1: Negative.

## 2023-02-10 NOTE — Telephone Encounter (Signed)
Left message on vm to call back to schedule appt to pick up orthotics - No Balance

## 2023-03-12 ENCOUNTER — Other Ambulatory Visit: Payer: Self-pay | Admitting: Family Medicine

## 2023-03-12 DIAGNOSIS — Z1231 Encounter for screening mammogram for malignant neoplasm of breast: Secondary | ICD-10-CM

## 2023-05-27 ENCOUNTER — Inpatient Hospital Stay: Admission: RE | Admit: 2023-05-27 | Payer: Managed Care, Other (non HMO) | Source: Ambulatory Visit

## 2024-03-31 ENCOUNTER — Institutional Professional Consult (permissible substitution) (INDEPENDENT_AMBULATORY_CARE_PROVIDER_SITE_OTHER): Payer: Self-pay

## 2024-04-26 ENCOUNTER — Institutional Professional Consult (permissible substitution) (INDEPENDENT_AMBULATORY_CARE_PROVIDER_SITE_OTHER): Payer: Self-pay
